# Patient Record
Sex: Male | Born: 1985 | Race: Black or African American | Hispanic: No | Marital: Single | Smoking: Never smoker
Health system: Southern US, Community
[De-identification: ages and names within clinical notes are randomized; demographics above are authoritative.]

## PROBLEM LIST (undated history)

## (undated) DIAGNOSIS — K859 Acute pancreatitis without necrosis or infection, unspecified: Secondary | ICD-10-CM

## (undated) DIAGNOSIS — T148XXA Other injury of unspecified body region, initial encounter: Secondary | ICD-10-CM

## (undated) HISTORY — PX: TONSILLECTOMY: SUR1361

## (undated) HISTORY — PX: APPENDECTOMY: SHX54

---
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
## 2017-11-10 ENCOUNTER — Emergency Department (HOSPITAL_COMMUNITY): Payer: 59

## 2017-11-10 ENCOUNTER — Ambulatory Visit (HOSPITAL_COMMUNITY)
Admission: EM | Admit: 2017-11-10 | Discharge: 2017-11-10 | Disposition: A | Discharge status: Home / Self Care | Payer: 59 | Attending: Family Medicine | Admitting: Family Medicine

## 2017-11-10 ENCOUNTER — Emergency Department (HOSPITAL_COMMUNITY)
Admission: EM | Admit: 2017-11-10 | Discharge: 2017-11-10 | Disposition: A | Discharge status: Home / Self Care | Payer: 59 | Attending: Emergency Medicine | Admitting: Emergency Medicine

## 2017-11-10 ENCOUNTER — Encounter (HOSPITAL_COMMUNITY): Payer: Self-pay

## 2017-11-10 ENCOUNTER — Other Ambulatory Visit: Payer: Self-pay

## 2017-11-10 DIAGNOSIS — R1012 Left upper quadrant pain: Secondary | ICD-10-CM | POA: Diagnosis present

## 2017-11-10 DIAGNOSIS — K859 Acute pancreatitis without necrosis or infection, unspecified: Secondary | ICD-10-CM | POA: Insufficient documentation

## 2017-11-10 DIAGNOSIS — R109 Unspecified abdominal pain: Secondary | ICD-10-CM | POA: Diagnosis not present

## 2017-11-10 LAB — COMPREHENSIVE METABOLIC PANEL
ALT: 36 U/L (ref 0–44)
AST: 42 U/L — AB (ref 15–41)
Albumin: 3.6 g/dL (ref 3.5–5.0)
Alkaline Phosphatase: 113 U/L (ref 38–126)
Anion gap: 14 (ref 5–15)
BUN: 6 mg/dL (ref 6–20)
CO2: 24 mmol/L (ref 22–32)
CREATININE: 1.09 mg/dL (ref 0.61–1.24)
Calcium: 9.8 mg/dL (ref 8.9–10.3)
Chloride: 98 mmol/L (ref 98–111)
GFR calc non Af Amer: >60 mL/min (ref >60)
Glucose, Bld: 113 mg/dL — ABNORMAL HIGH (ref 70–99)
POTASSIUM: 4.1 mmol/L (ref 3.5–5.1)
SODIUM: 136 mmol/L (ref 135–145)
Total Bilirubin: 1.9 mg/dL — ABNORMAL HIGH (ref 0.3–1.2)
Total Protein: 7.6 g/dL (ref 6.5–8.1)

## 2017-11-10 LAB — CBC
HEMATOCRIT: 48.4 % (ref 39.0–52.0)
Hemoglobin: 15.4 g/dL (ref 13.0–17.0)
MCH: 28.8 pg (ref 26.0–34.0)
MCHC: 31.8 g/dL (ref 30.0–36.0)
MCV: 90.5 fL (ref 78.0–100.0)
PLATELETS: 278 10*3/uL (ref 150–400)
RBC: 5.35 MIL/uL (ref 4.22–5.81)
RDW: 14.1 % (ref 11.5–15.5)
WBC: 11.4 10*3/uL — ABNORMAL HIGH (ref 4.0–10.5)

## 2017-11-10 LAB — URINALYSIS, ROUTINE W REFLEX MICROSCOPIC
BACTERIA UA: NONE SEEN
Bilirubin Urine: NEGATIVE
Glucose, UA: NEGATIVE mg/dL
Hgb urine dipstick: NEGATIVE
KETONES UR: NEGATIVE mg/dL
LEUKOCYTES UA: NEGATIVE
Nitrite: NEGATIVE
PROTEIN: 30 mg/dL — AB
Specific Gravity, Urine: 1.023 (ref 1.005–1.030)
pH: 5 (ref 5.0–8.0)

## 2017-11-10 LAB — LIPASE, BLOOD: Lipase: 124 U/L — ABNORMAL HIGH (ref 11–51)

## 2017-11-10 LAB — I-STAT TROPONIN, ED: TROPONIN I, POC: 0 ng/mL (ref 0.00–0.08)

## 2017-11-10 MED ORDER — HYDROCODONE-ACETAMINOPHEN 5-325 MG PO TABS: 2.0000 | ORAL_TABLET | ORAL | 0 refills | Status: DC | PRN

## 2017-11-10 MED ORDER — SODIUM CHLORIDE 0.9 % IV BOLUS
1000.0000 mL | Freq: Once | INTRAVENOUS | Status: AC
  Administered 2017-11-10: 1000 mL via INTRAVENOUS

## 2017-11-10 MED ORDER — IOPAMIDOL (ISOVUE-300) INJECTION 61%
INTRAVENOUS | Status: AC
  Filled 2017-11-10: qty 100

## 2017-11-10 MED ORDER — ONDANSETRON HCL 4 MG PO TABS: 4.0000 mg | ORAL_TABLET | Freq: Three times a day (TID) | ORAL | 0 refills | Status: DC | PRN

## 2017-11-10 MED ORDER — IOPAMIDOL (ISOVUE-300) INJECTION 61%
100.0000 mL | Freq: Once | INTRAVENOUS | Status: AC | PRN
  Administered 2017-11-10: 100 mL via INTRAVENOUS

## 2017-11-10 MED ORDER — MORPHINE SULFATE (PF) 4 MG/ML IV SOLN
4.0000 mg | Freq: Once | INTRAVENOUS | Status: AC
  Administered 2017-11-10: 4 mg via INTRAVENOUS
  Filled 2017-11-10: qty 1

## 2017-11-10 MED ORDER — ONDANSETRON HCL 4 MG/2ML IJ SOLN
4.0000 mg | Freq: Once | INTRAMUSCULAR | Status: AC
  Administered 2017-11-10: 4 mg via INTRAVENOUS
  Filled 2017-11-10: qty 2

## 2017-11-10 NOTE — ED Notes (Signed)
Patient transported to CT 

## 2017-11-10 NOTE — Discharge Instructions (Signed)
GO TO ER °

## 2017-11-10 NOTE — ED Triage Notes (Signed)
Patient complains of abdominal pain with radiation to back and constipation x 2 days, reports nausea with any intake. Appears uncomfortable on arrival. Constant pain x 2 days

## 2017-11-10 NOTE — ED Triage Notes (Signed)
Pt presents with abdominal pain mostly by sternum that radiates down to left upper quadrant and left side of back .

## 2017-11-10 NOTE — ED Notes (Signed)
Pt escorted to the ER.

## 2017-11-10 NOTE — ED Provider Notes (Signed)
[facility] EMERGENCY DEPARTMENT Provider Note   CSN: [id number] Arrival date & time: 11/10/17  4098     History   Chief Complaint No chief complaint on file.   HPI Henry Hendrix is a 32 y.o. male.  32 year old male presents with complaint of left upper abdominal pain.  Patient states that pain woke him from his sleep 2 nights ago in the epigastric area, described as a sore feeling, now located in the left upper quadrant area.  Patient reports 3 episodes of vomiting last night, feels nauseous if he eats or drinks anything.  Reports last bowel movement was 2 days ago, decrease in appetite, muscle ache in his back that is worse with movement, also chills/sweats, no fevers.  His abdominal surgeries include appendectomy. Denies chest pain, shortness of breath, changes in bladder habits. No other complaints or concerns.      History reviewed. No pertinent past medical history.  There are no active problems to display for this patient.   Past Surgical History:  Procedure Laterality Date  . APPENDECTOMY    . TONSILLECTOMY          Home Medications    Prior to Admission medications   Medication Sig Start Date End Date Taking? Authorizing Provider  HYDROcodone-acetaminophen (NORCO/VICODIN) 5-325 MG tablet Take 2 tablets by mouth every 4 (four) hours as needed. 11/10/17   Jeannie Fend, PA-C  ondansetron (ZOFRAN) 4 MG tablet Take 1 tablet (4 mg total) by mouth every 8 (eight) hours as needed for nausea or vomiting. 11/10/17   Jeannie Fend, PA-C    Family History No family history on file.  Social History Social History   Tobacco Use  . Smoking status: Never Smoker  . Smokeless tobacco: Never Used  Substance Use Topics  . Alcohol use: Yes  . Drug use: Not on file     Allergies   Patient has no known allergies.   Review of Systems Review of Systems  Constitutional: Positive for chills and diaphoresis. Negative for fever.  Respiratory: Negative  for shortness of breath.   Cardiovascular: Negative for chest pain.  Gastrointestinal: Positive for abdominal pain, constipation, nausea and vomiting. Negative for abdominal distention, blood in stool and diarrhea.  Genitourinary: Negative for difficulty urinating, dysuria, flank pain, frequency and urgency.  Musculoskeletal: Positive for back pain.  Skin: Negative for rash and wound.  Allergic/Immunologic: Negative for immunocompromised state.  Neurological: Negative for dizziness, weakness and headaches.  Hematological: Does not bruise/bleed easily.  Psychiatric/Behavioral: Negative for confusion.  All other systems reviewed and are negative.    Physical Exam Updated Vital Signs BP (!) 168/85 (BP Location: Right Arm)   Pulse 92   Temp 98.7 F (37.1 C) (Oral)   Resp 17   SpO2 99%   Physical Exam  Constitutional: He is oriented to person, place, and time. He appears well-developed and well-nourished. No distress.  HENT:  Head: Normocephalic and atraumatic.  Cardiovascular: Normal rate, regular rhythm, normal heart sounds and intact distal pulses.  No murmur heard. Pulmonary/Chest: Effort normal and breath sounds normal. No respiratory distress.  Abdominal: Soft. Bowel sounds are normal. He exhibits no distension. There is tenderness in the left upper quadrant. There is no guarding.  Musculoskeletal: He exhibits no tenderness.       Thoracic back: He exhibits no tenderness.       Lumbar back: He exhibits no tenderness.  Neurological: He is alert and oriented to person, place, and time. No sensory deficit.  Skin: Skin is warm and dry. He is not diaphoretic.  Psychiatric: He has a normal mood and affect. His behavior is normal.  Nursing note and vitals reviewed.    ED Treatments / Results  Labs (all labs ordered are listed, but only abnormal results are displayed) Labs Reviewed  LIPASE, BLOOD - Abnormal; Notable for the following components:      Result Value   Lipase 124  (*)    All other components within normal limits  COMPREHENSIVE METABOLIC PANEL - Abnormal; Notable for the following components:   Glucose, Bld 113 (*)    AST 42 (*)    Total Bilirubin 1.9 (*)    All other components within normal limits  CBC - Abnormal; Notable for the following components:   WBC 11.4 (*)    All other components within normal limits  URINALYSIS, ROUTINE W REFLEX MICROSCOPIC - Abnormal; Notable for the following components:   Color, Urine AMBER (*)    Protein, ur 30 (*)    All other components within normal limits  I-STAT TROPONIN, ED    EKG EKG Interpretation  Date/Time:  Friday November 10 2017 08:49:17 EDT Ventricular Rate:  97 PR Interval:  134 QRS Duration: 82 QT Interval:  354 QTC Calculation: 449 R Axis:   33 Text Interpretation:  Normal sinus rhythm Normal ECG No significant change was found Confirmed by Richardean Canal [phone]) on 11/10/2017 1:32:50 PM   Radiology Ct Abdomen Pelvis W Contrast  Result Date: 11/10/2017 CLINICAL DATA:  Epigastric and left upper quadrant abdominal pain. Elevated lipase. Nausea. EXAM: CT ABDOMEN AND PELVIS WITH CONTRAST TECHNIQUE: Multidetector CT imaging of the abdomen and pelvis was performed using the standard protocol following bolus administration of intravenous contrast. CONTRAST:  100 cc ISOVUE-300 IOPAMIDOL (ISOVUE-300) INJECTION 61% COMPARISON:  Radiographs dated 11/10/2017 FINDINGS: Lower chest: No significant abnormality. Hepatobiliary: There is hepatomegaly with hepatic steatosis. No focal liver lesions. Biliary tree is normal. Pancreas: There is soft tissue haziness around the pancreatic head. Body and tail of the pancreas are normal. No mass lesions. Spleen: Normal. Adrenals/Urinary Tract: Adrenal glands are unremarkable. Kidneys are normal, without renal calculi, focal lesion, or hydronephrosis. Bladder is unremarkable. Stomach/Bowel: Stomach is within normal limits. Appendix appears normal. No evidence of bowel wall  thickening, distention, or inflammatory changes. Vascular/Lymphatic: No significant vascular findings are present. There are a few scattered shotty periaortic lymph nodes. Reproductive: Prostate is unremarkable. Other: No abdominal wall hernia or abnormality. No abdominopelvic ascites. Musculoskeletal: No acute or significant osseous findings. IMPRESSION: 1. Findings consistent with acute pancreatitis involving the pancreatic head. No visible pancreatic necrosis or pseudocyst. 2. Hepatomegaly with diffuse hepatic steatosis. Electronically Signed   By: Francene Boyers M.D.   On: 11/10/2017 13:07   Dg Abd Acute W/chest  Result Date: 11/10/2017 CLINICAL DATA:  Abdominal pain EXAM: DG ABDOMEN ACUTE W/ 1V CHEST COMPARISON:  None. FINDINGS: There is no evidence of dilated bowel loops or free intraperitoneal air. No radiopaque calculi or other significant radiographic abnormality is seen. Heart size and mediastinal contours are within normal limits. Both lungs are clear. IMPRESSION: Negative abdominal radiographs.  No acute cardiopulmonary disease. Electronically Signed   By: Charlett Nose M.D.   On: 11/10/2017 10:10    Procedures Procedures (including critical care time)  Medications Ordered in ED Medications  iopamidol (ISOVUE-300) 61 % injection 100 mL (100 mLs Intravenous Contrast Given 11/10/17 1248)  sodium chloride 0.9 % bolus 1,000 mL (1,000 mLs Intravenous New Bag/Given 11/10/17 1404)  morphine  4 MG/ML injection 4 mg (4 mg Intravenous Given 11/10/17 1406)  ondansetron (ZOFRAN) injection 4 mg (4 mg Intravenous Given 11/10/17 1406)     Initial Impression / Assessment and Plan / ED Course  I have reviewed the triage vital signs and the nursing notes.  Pertinent labs & imaging results that were available during my care of the patient were reviewed by me and considered in my medical decision making (see chart for details).  Clinical Course as of Nov 11 1523  Fri Nov 10, 2017  1521 31yo male with  epigastric/LUQ abdominal pain with nausea and vomiting. No history of similar symptoms previously, previus appendectomy as a child. On exam, LUQ TTP, patient will appearing otherwise. CT with uncomplicated pancreatitis, elevated lipase at 124, elevated total bili at 1.9, normal CMP otherwise. WBC slightly elevated at 11.4, UA with protein. Discussed with Dr. Silverio LayYao, patient may be dc home if tolerating POs and pain controlled. Upon recheck, patient reports worsening pain. Patient was given morphine, zofran, fluids (did not need meds for pain initially). Recheck after meds, patient is feeling much her and is ready for discharge home.  Patient was given strict return to ER precautions, prescription for Norco, no recent prescriptions filled otherwise for controlled substances.  Also given prescription for Zofran.  Return to ER for fevers, worsening pain, uncontrolled vomiting or any other concerning symptoms.  Patient verbalizes understanding of discharge instructions and plan.   [LM]    Clinical Course User Index [LM] Jeannie FendMurphy, Lochlin Eppinger A, PA-C    Final Clinical Impressions(s) / ED Diagnoses   Final diagnoses:  Acute pancreatitis without infection or necrosis, unspecified pancreatitis type    ED Discharge Orders         Ordered    HYDROcodone-acetaminophen (NORCO/VICODIN) 5-325 MG tablet  Every 4 hours PRN     11/10/17 1519    ondansetron (ZOFRAN) 4 MG tablet  Every 8 hours PRN     11/10/17 1519           Jeannie FendMurphy, Deserai Cansler A, PA-C 11/10/17 1525    Charlynne PanderYao, David Hsienta, MD 11/12/17 2122

## 2017-11-10 NOTE — Discharge Instructions (Addendum)
Liquid diet, progress as tolerated. Norco as needed as prescribed for pain. Zofran as needed as prescribed for nausea/vomiting. Follow up with primary care provider, referral given if needed. Return to ER for worsening or concerning symptoms especially fever, worsening pain, uncontrolled vomiting.

## 2017-11-10 NOTE — ED Provider Notes (Signed)
[facility]    CSN: [id number] Arrival date & time: 11/10/17  0800     History   Chief Complaint Chief Complaint  Patient presents with  . Abdominal Pain    mostly by sternum but radiates down left side    HPI Henry Hendrix is a 32 y.o. male.   HPI  Patient is here for abdominal pain.  Is been present for several days.  He states that it is worse today than it was yesterday.  It is in the left upper and middle abdomen.  It is a crampy pain, worse in certain positions.  He has nausea and vomiting.  No diarrhea.  No fever chills.  He denies any new foods.  He denies excessive alcohol use.  He has not been around anybody who is sick.  History reviewed. No pertinent past medical history.  There are no active problems to display for this patient.   Past Surgical History:  Procedure Laterality Date  . APPENDECTOMY    . TONSILLECTOMY         Home Medications    Prior to Admission medications   Medication Sig Start Date End Date Taking? Authorizing Provider  HYDROcodone-acetaminophen (NORCO/VICODIN) 5-325 MG tablet Take 2 tablets by mouth every 4 (four) hours as needed. 11/10/17   Jeannie Fend, PA-C  ondansetron (ZOFRAN) 4 MG tablet Take 1 tablet (4 mg total) by mouth every 8 (eight) hours as needed for nausea or vomiting. 11/10/17   Jeannie Fend, PA-C    Family History History reviewed. No pertinent family history.  Social History Social History   Tobacco Use  . Smoking status: Never Smoker  . Smokeless tobacco: Never Used  Substance Use Topics  . Alcohol use: Yes  . Drug use: Not on file     Allergies   Patient has no known allergies.   Review of Systems Review of Systems  Constitutional: Negative for chills and fever.  HENT: Negative for ear pain and sore throat.   Eyes: Negative for pain and visual disturbance.  Respiratory: Negative for cough and shortness of breath.   Cardiovascular: Negative for chest pain and palpitations.    Gastrointestinal: Positive for abdominal pain, nausea and vomiting. Negative for abdominal distention and diarrhea.  Genitourinary: Negative for dysuria and hematuria.  Musculoskeletal: Negative for arthralgias and back pain.  Skin: Negative for color change and rash.  Neurological: Negative for seizures and syncope.  All other systems reviewed and are negative.    Physical Exam Triage Vital Signs ED Triage Vitals  Enc Vitals Group     BP 11/10/17 0822 (!) 147/99     Pulse Rate 11/10/17 0822 93     Resp 11/10/17 0822 20     Temp 11/10/17 0822 98.3 F (36.8 C)     Temp Source 11/10/17 0822 Oral     SpO2 11/10/17 0822 98 %     Weight --      Height --      Head Circumference --      Peak Flow --      Pain Score 11/10/17 0820 7     Pain Loc --      Pain Edu? --      Excl. in GC? --    No data found.  Updated Vital Signs BP (!) 147/99 (BP Location: Left Arm)   Pulse 93   Temp 98.3 F (36.8 C) (Oral)   Resp 20   SpO2 98%   Visual Acuity Right  Eye Distance:   Left Eye Distance:   Bilateral Distance:    Right Eye Near:   Left Eye Near:    Bilateral Near:     Physical Exam  Constitutional: He appears well-developed and well-nourished. No distress.  Patient is obviously uncomfortable.  He is standing beside the exam table bent forward at the waist leaning his forehead on the table.  Clutches abdomen.  HENT:  Head: Normocephalic and atraumatic.  Mouth/Throat: Oropharynx is clear and moist.  Eyes: Pupils are equal, round, and reactive to light. Conjunctivae are normal.  Neck: Normal range of motion.  Cardiovascular: Normal rate.  Pulmonary/Chest: Effort normal. No respiratory distress.  Abdominal: Soft. He exhibits no distension. Bowel sounds are decreased. There is no hepatosplenomegaly. There is tenderness in the epigastric area and left upper quadrant. There is rebound and guarding. There is no rigidity. No hernia.  Musculoskeletal: Normal range of motion. He  exhibits no edema.  Neurological: He is alert.  Skin: Skin is warm and dry.  Psychiatric: He has a normal mood and affect. His behavior is normal.     UC Treatments / Results  Labs (all labs ordered are listed, but only abnormal results are displayed) Labs Reviewed - No data to display  EKG None  Radiology Ct Abdomen Pelvis W Contrast  Result Date: 11/10/2017 CLINICAL DATA:  Epigastric and left upper quadrant abdominal pain. Elevated lipase. Nausea. EXAM: CT ABDOMEN AND PELVIS WITH CONTRAST TECHNIQUE: Multidetector CT imaging of the abdomen and pelvis was performed using the standard protocol following bolus administration of intravenous contrast. CONTRAST:  100 cc ISOVUE-300 IOPAMIDOL (ISOVUE-300) INJECTION 61% COMPARISON:  Radiographs dated 11/10/2017 FINDINGS: Lower chest: No significant abnormality. Hepatobiliary: There is hepatomegaly with hepatic steatosis. No focal liver lesions. Biliary tree is normal. Pancreas: There is soft tissue haziness around the pancreatic head. Body and tail of the pancreas are normal. No mass lesions. Spleen: Normal. Adrenals/Urinary Tract: Adrenal glands are unremarkable. Kidneys are normal, without renal calculi, focal lesion, or hydronephrosis. Bladder is unremarkable. Stomach/Bowel: Stomach is within normal limits. Appendix appears normal. No evidence of bowel wall thickening, distention, or inflammatory changes. Vascular/Lymphatic: No significant vascular findings are present. There are a few scattered shotty periaortic lymph nodes. Reproductive: Prostate is unremarkable. Other: No abdominal wall hernia or abnormality. No abdominopelvic ascites. Musculoskeletal: No acute or significant osseous findings. IMPRESSION: 1. Findings consistent with acute pancreatitis involving the pancreatic head. No visible pancreatic necrosis or pseudocyst. 2. Hepatomegaly with diffuse hepatic steatosis. Electronically Signed   By: Francene BoyersJames  Maxwell M.D.   On: 11/10/2017 13:07   Dg  Abd Acute W/chest  Result Date: 11/10/2017 CLINICAL DATA:  Abdominal pain EXAM: DG ABDOMEN ACUTE W/ 1V CHEST COMPARISON:  None. FINDINGS: There is no evidence of dilated bowel loops or free intraperitoneal air. No radiopaque calculi or other significant radiographic abnormality is seen. Heart size and mediastinal contours are within normal limits. Both lungs are clear. IMPRESSION: Negative abdominal radiographs.  No acute cardiopulmonary disease. Electronically Signed   By: Charlett NoseKevin  Dover M.D.   On: 11/10/2017 10:10    Procedures Procedures (including critical care time)  Medications Ordered in UC Medications - No data to display  Initial Impression / Assessment and Plan / UC Course  I have reviewed the triage vital signs and the nursing notes.  Pertinent labs & imaging results that were available during my care of the patient were reviewed by me and considered in my medical decision making (see chart for details).  I explained to the patient that there is limitation in the diagnostic tests that can be done at the urgent care center, and that in my opinion he belonged in the emergency room where he could have additional testing and scans. Final Clinical Impressions(s) / UC Diagnoses   Final diagnoses:  Acute abdominal pain     Discharge Instructions     GO TO ER   ED Prescriptions    None     Controlled Substance Prescriptions  Controlled Substance Registry consulted? Not Applicable   Eustace Moore, MD 11/10/17 [phone]

## 2017-11-10 NOTE — ED Notes (Signed)
Pt continues to have pain.  Medicated and repositioned for comfort

## 2017-11-10 NOTE — ED Notes (Signed)
Patient transported to X-ray 

## 2018-04-30 DIAGNOSIS — Z8719 Personal history of other diseases of the digestive system: Secondary | ICD-10-CM | POA: Insufficient documentation

## 2018-11-07 ENCOUNTER — Other Ambulatory Visit: Payer: Self-pay

## 2018-11-07 DIAGNOSIS — Z20822 Contact with and (suspected) exposure to covid-19: Secondary | ICD-10-CM

## 2018-11-08 LAB — NOVEL CORONAVIRUS, NAA: SARS-CoV-2, NAA: NOT DETECTED

## 2019-01-24 ENCOUNTER — Other Ambulatory Visit: Payer: Self-pay

## 2019-01-24 DIAGNOSIS — Z20822 Contact with and (suspected) exposure to covid-19: Secondary | ICD-10-CM

## 2019-01-25 LAB — NOVEL CORONAVIRUS, NAA: SARS-CoV-2, NAA: NOT DETECTED

## 2019-06-13 ENCOUNTER — Ambulatory Visit: Payer: Self-pay | Attending: Internal Medicine

## 2019-07-14 ENCOUNTER — Encounter (HOSPITAL_COMMUNITY): Payer: Self-pay | Admitting: Emergency Medicine

## 2019-07-14 ENCOUNTER — Other Ambulatory Visit: Payer: Self-pay

## 2019-07-14 ENCOUNTER — Emergency Department (HOSPITAL_COMMUNITY): Payer: Self-pay

## 2019-07-14 ENCOUNTER — Emergency Department (HOSPITAL_COMMUNITY)
Admission: EM | Admit: 2019-07-14 | Discharge: 2019-07-14 | Disposition: A | Discharge status: Home / Self Care | Payer: Self-pay | Attending: Emergency Medicine | Admitting: Emergency Medicine

## 2019-07-14 DIAGNOSIS — T1490XA Injury, unspecified, initial encounter: Secondary | ICD-10-CM

## 2019-07-14 DIAGNOSIS — Z20822 Contact with and (suspected) exposure to covid-19: Secondary | ICD-10-CM | POA: Insufficient documentation

## 2019-07-14 DIAGNOSIS — Z23 Encounter for immunization: Secondary | ICD-10-CM | POA: Insufficient documentation

## 2019-07-14 DIAGNOSIS — K869 Disease of pancreas, unspecified: Secondary | ICD-10-CM | POA: Insufficient documentation

## 2019-07-14 DIAGNOSIS — F10920 Alcohol use, unspecified with intoxication, uncomplicated: Secondary | ICD-10-CM | POA: Insufficient documentation

## 2019-07-14 DIAGNOSIS — Y906 Blood alcohol level of 120-199 mg/100 ml: Secondary | ICD-10-CM | POA: Insufficient documentation

## 2019-07-14 DIAGNOSIS — S21212A Laceration without foreign body of left back wall of thorax without penetration into thoracic cavity, initial encounter: Secondary | ICD-10-CM | POA: Insufficient documentation

## 2019-07-14 DIAGNOSIS — Y929 Unspecified place or not applicable: Secondary | ICD-10-CM | POA: Insufficient documentation

## 2019-07-14 DIAGNOSIS — R590 Localized enlarged lymph nodes: Secondary | ICD-10-CM | POA: Insufficient documentation

## 2019-07-14 DIAGNOSIS — T148XXA Other injury of unspecified body region, initial encounter: Secondary | ICD-10-CM

## 2019-07-14 DIAGNOSIS — Y939 Activity, unspecified: Secondary | ICD-10-CM | POA: Insufficient documentation

## 2019-07-14 DIAGNOSIS — Y999 Unspecified external cause status: Secondary | ICD-10-CM | POA: Insufficient documentation

## 2019-07-14 LAB — URINALYSIS, ROUTINE W REFLEX MICROSCOPIC
Bilirubin Urine: NEGATIVE
Glucose, UA: NEGATIVE mg/dL
Hgb urine dipstick: NEGATIVE
Ketones, ur: NEGATIVE mg/dL
Leukocytes,Ua: NEGATIVE
Nitrite: NEGATIVE
Protein, ur: NEGATIVE mg/dL
Specific Gravity, Urine: 1.032 — ABNORMAL HIGH (ref 1.005–1.030)
pH: 6 (ref 5.0–8.0)

## 2019-07-14 LAB — RESPIRATORY PANEL BY RT PCR (FLU A&B, COVID)
Influenza A by PCR: NEGATIVE
Influenza B by PCR: NEGATIVE
SARS Coronavirus 2 by RT PCR: NEGATIVE

## 2019-07-14 LAB — CBC
HCT: 43.4 % (ref 39.0–52.0)
Hemoglobin: 13.6 g/dL (ref 13.0–17.0)
MCH: 28.5 pg (ref 26.0–34.0)
MCHC: 31.3 g/dL (ref 30.0–36.0)
MCV: 91 fL (ref 80.0–100.0)
Platelets: 248 10*3/uL (ref 150–400)
RBC: 4.77 MIL/uL (ref 4.22–5.81)
RDW: 15.5 % (ref 11.5–15.5)
WBC: 8.6 10*3/uL (ref 4.0–10.5)
nRBC: 0 % (ref 0.0–0.2)

## 2019-07-14 LAB — COMPREHENSIVE METABOLIC PANEL
ALT: 37 U/L (ref 0–44)
AST: 74 U/L — ABNORMAL HIGH (ref 15–41)
Albumin: 2.9 g/dL — ABNORMAL LOW (ref 3.5–5.0)
Alkaline Phosphatase: 173 U/L — ABNORMAL HIGH (ref 38–126)
Anion gap: 18 — ABNORMAL HIGH (ref 5–15)
BUN: 8 mg/dL (ref 6–20)
CO2: 16 mmol/L — ABNORMAL LOW (ref 22–32)
Calcium: 8.4 mg/dL — ABNORMAL LOW (ref 8.9–10.3)
Chloride: 109 mmol/L (ref 98–111)
Creatinine, Ser: 1.24 mg/dL (ref 0.61–1.24)
GFR calc Af Amer: >60 mL/min (ref >60)
GFR calc non Af Amer: >60 mL/min (ref >60)
Glucose, Bld: 125 mg/dL — ABNORMAL HIGH (ref 70–99)
Potassium: 3.8 mmol/L (ref 3.5–5.1)
Sodium: 143 mmol/L (ref 135–145)
Total Bilirubin: 0.5 mg/dL (ref 0.3–1.2)
Total Protein: 5.9 g/dL — ABNORMAL LOW (ref 6.5–8.1)

## 2019-07-14 LAB — I-STAT CHEM 8, ED
BUN: 9 mg/dL (ref 6–20)
Calcium, Ion: 1.01 mmol/L — ABNORMAL LOW (ref 1.15–1.40)
Chloride: 108 mmol/L (ref 98–111)
Creatinine, Ser: 1.3 mg/dL — ABNORMAL HIGH (ref 0.61–1.24)
Glucose, Bld: 120 mg/dL — ABNORMAL HIGH (ref 70–99)
HCT: 43 % (ref 39.0–52.0)
Hemoglobin: 14.6 g/dL (ref 13.0–17.0)
Potassium: 4.4 mmol/L (ref 3.5–5.1)
Sodium: 143 mmol/L (ref 135–145)
TCO2: 19 mmol/L — ABNORMAL LOW (ref 22–32)

## 2019-07-14 LAB — PROTIME-INR
INR: 1 (ref 0.8–1.2)
Prothrombin Time: 13.1 seconds (ref 11.4–15.2)

## 2019-07-14 LAB — SAMPLE TO BLOOD BANK

## 2019-07-14 LAB — LACTIC ACID, PLASMA
Lactic Acid, Venous: 8.2 mmol/L (ref 0.5–1.9)
Lactic Acid, Venous: 6 mmol/L (ref 0.5–1.9)

## 2019-07-14 LAB — ETHANOL: Alcohol, Ethyl (B): 163 mg/dL — ABNORMAL HIGH (ref <10)

## 2019-07-14 MED ORDER — KETOROLAC TROMETHAMINE 30 MG/ML IJ SOLN
30.0000 mg | Freq: Once | INTRAMUSCULAR | Status: AC
  Administered 2019-07-14: 30 mg via INTRAVENOUS
  Filled 2019-07-14: qty 1

## 2019-07-14 MED ORDER — CEPHALEXIN 500 MG PO CAPS
500.0000 mg | ORAL_CAPSULE | Freq: Four times a day (QID) | ORAL | 0 refills | Status: DC
Start: 2019-07-14 — End: 2019-08-02

## 2019-07-14 MED ORDER — CEFAZOLIN SODIUM-DEXTROSE 1-4 GM/50ML-% IV SOLN
1.0000 g | Freq: Once | INTRAVENOUS | Status: AC
  Administered 2019-07-14: 1 g via INTRAVENOUS

## 2019-07-14 MED ORDER — IOHEXOL 300 MG/ML  SOLN
100.0000 mL | Freq: Once | INTRAMUSCULAR | Status: AC | PRN
  Administered 2019-07-14: 100 mL via INTRAVENOUS

## 2019-07-14 MED ORDER — TETANUS-DIPHTH-ACELL PERTUSSIS 5-2.5-18.5 LF-MCG/0.5 IM SUSP
0.5000 mL | Freq: Once | INTRAMUSCULAR | Status: AC
  Administered 2019-07-14: 0.5 mL via INTRAMUSCULAR

## 2019-07-14 MED ORDER — LIDOCAINE-EPINEPHRINE (PF) 2 %-1:200000 IJ SOLN
INTRAMUSCULAR | Status: AC
  Administered 2019-07-14: 20 mL
  Filled 2019-07-14: qty 20

## 2019-07-14 MED ORDER — NAPROXEN 375 MG PO TABS
375.0000 mg | ORAL_TABLET | Freq: Two times a day (BID) | ORAL | 0 refills | Status: DC
Start: 2019-07-14 — End: 2019-08-02

## 2019-07-14 NOTE — ED Notes (Signed)
Date and time results received: 07/14/19  (use smartphrase ".now" to insert current time)  Test: lactic Critical Value: 8.2  Name of Provider Notified: Marya Fossa
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
## 2019-07-14 NOTE — Discharge Instructions (Addendum)
Follow up with GI and community wellness for pancreatic lesion seen on CT scan you will need an outpatient MRI of this area.
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
## 2019-07-14 NOTE — ED Notes (Signed)
Phone provided to the pt to talk and update mom over the phone. Awaiting for more detail for POC.

## 2019-07-15 ENCOUNTER — Encounter (HOSPITAL_COMMUNITY): Payer: Self-pay

## 2019-07-17 ENCOUNTER — Encounter: Payer: Self-pay | Admitting: Nurse Practitioner

## 2019-07-25 ENCOUNTER — Ambulatory Visit (HOSPITAL_COMMUNITY)
Admission: EM | Admit: 2019-07-25 | Discharge: 2019-07-25 | Disposition: A | Discharge status: Home / Self Care | Payer: BC Managed Care – PPO

## 2019-07-25 ENCOUNTER — Other Ambulatory Visit: Payer: Self-pay
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
## 2019-08-01 ENCOUNTER — Ambulatory Visit: Payer: Self-pay | Admitting: Nurse Practitioner

## 2019-08-02 ENCOUNTER — Ambulatory Visit (HOSPITAL_COMMUNITY)
Admission: EM | Admit: 2019-08-02 | Discharge: 2019-08-02 | Disposition: A | Discharge status: Home / Self Care | Payer: BC Managed Care – PPO | Attending: Family Medicine | Admitting: Family Medicine

## 2019-08-02 ENCOUNTER — Encounter (HOSPITAL_COMMUNITY): Payer: Self-pay

## 2019-08-02 ENCOUNTER — Telehealth: Payer: BC Managed Care – PPO

## 2019-08-02 ENCOUNTER — Other Ambulatory Visit: Payer: Self-pay

## 2019-08-02 DIAGNOSIS — R1084 Generalized abdominal pain: Secondary | ICD-10-CM

## 2019-08-02 DIAGNOSIS — K5900 Constipation, unspecified: Secondary | ICD-10-CM

## 2019-08-02 DIAGNOSIS — Z8719 Personal history of other diseases of the digestive system: Secondary | ICD-10-CM

## 2019-08-02 HISTORY — DX: Other injury of unspecified body region, initial encounter: T14.8XXA

## 2019-08-02 HISTORY — DX: Acute pancreatitis without necrosis or infection, unspecified: K85.90

## 2019-08-02 MED ORDER — ONDANSETRON HCL 4 MG PO TABS: 4.0000 mg | ORAL_TABLET | Freq: Three times a day (TID) | ORAL | 0 refills | Status: AC | PRN

## 2019-08-02 MED ORDER — HYDROCODONE-ACETAMINOPHEN 7.5-325 MG PO TABS: 1.0000 | ORAL_TABLET | Freq: Four times a day (QID) | ORAL | 0 refills | Status: AC | PRN

## 2019-08-02 NOTE — ED Triage Notes (Signed)
Pt presents with generalized abdominal pain, nausea and vomiting; pt states he believes he ate something that aggravated his acute pancreatitis.  Pt states he has a follow up scheduled with gastroenterology.

## 2019-08-02 NOTE — Discharge Instructions (Signed)
Drink plenty of water May advance to bland diet as tolerated Take ondansetron (Zofran) as needed for nausea and vomiting Take hydrocodone as needed for pain Do not drive on hydrocodone Take MiraLAX (OTC) daily to prevent constipation Follow-up with your primary care doctor/gastroenterology
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
## 2021-10-15 ENCOUNTER — Encounter (HOSPITAL_COMMUNITY): Payer: Self-pay | Admitting: Internal Medicine

## 2021-10-15 ENCOUNTER — Inpatient Hospital Stay (HOSPITAL_BASED_OUTPATIENT_CLINIC_OR_DEPARTMENT_OTHER)
Admission: EM | Admit: 2021-10-15 | Discharge: 2021-10-17 | DRG: 872 | Disposition: A | Discharge status: Home / Self Care | Payer: Self-pay | Attending: Internal Medicine | Admitting: Internal Medicine

## 2021-10-15 ENCOUNTER — Other Ambulatory Visit: Payer: Self-pay

## 2021-10-15 ENCOUNTER — Emergency Department (HOSPITAL_BASED_OUTPATIENT_CLINIC_OR_DEPARTMENT_OTHER): Payer: Self-pay

## 2021-10-15 DIAGNOSIS — L03211 Cellulitis of face: Secondary | ICD-10-CM

## 2021-10-15 DIAGNOSIS — Z9049 Acquired absence of other specified parts of digestive tract: Secondary | ICD-10-CM

## 2021-10-15 DIAGNOSIS — A419 Sepsis, unspecified organism: Principal | ICD-10-CM

## 2021-10-15 LAB — COMPREHENSIVE METABOLIC PANEL
ALT: 38 U/L (ref 0–44)
AST: 70 U/L — ABNORMAL HIGH (ref 15–41)
Albumin: 4.2 g/dL (ref 3.5–5.0)
Alkaline Phosphatase: 272 U/L — ABNORMAL HIGH (ref 38–126)
Anion gap: 13 (ref 5–15)
BUN: 10 mg/dL (ref 6–20)
CO2: 24 mmol/L (ref 22–32)
Calcium: 9.4 mg/dL (ref 8.9–10.3)
Chloride: 102 mmol/L (ref 98–111)
Creatinine, Ser: 0.71 mg/dL (ref 0.61–1.24)
GFR, Estimated: >60 mL/min (ref >60)
Glucose, Bld: 91 mg/dL (ref 70–99)
Potassium: 3.8 mmol/L (ref 3.5–5.1)
Sodium: 139 mmol/L (ref 135–145)
Total Bilirubin: 0.9 mg/dL (ref 0.3–1.2)
Total Protein: 7.4 g/dL (ref 6.5–8.1)

## 2021-10-15 LAB — CBC WITH DIFFERENTIAL/PLATELET
Abs Immature Granulocytes: 0.01 10*3/uL (ref 0.00–0.07)
Basophils Absolute: 0.1 10*3/uL (ref 0.0–0.1)
Basophils Relative: 1 %
Eosinophils Absolute: 0.1 10*3/uL (ref 0.0–0.5)
Eosinophils Relative: 1 %
HCT: 40.3 % (ref 39.0–52.0)
Hemoglobin: 13.5 g/dL (ref 13.0–17.0)
Immature Granulocytes: 0 %
Lymphocytes Relative: 30 %
Lymphs Abs: 1.9 10*3/uL (ref 0.7–4.0)
MCH: 30.8 pg (ref 26.0–34.0)
MCHC: 33.5 g/dL (ref 30.0–36.0)
MCV: 92 fL (ref 80.0–100.0)
Monocytes Absolute: 0.9 10*3/uL (ref 0.1–1.0)
Monocytes Relative: 14 %
Neutro Abs: 3.4 10*3/uL (ref 1.7–7.7)
Neutrophils Relative %: 54 %
Platelets: 215 10*3/uL (ref 150–400)
RBC: 4.38 MIL/uL (ref 4.22–5.81)
RDW: 12.8 % (ref 11.5–15.5)
WBC: 6.3 10*3/uL (ref 4.0–10.5)
nRBC: 0 % (ref 0.0–0.2)

## 2021-10-15 MED ORDER — CHLORHEXIDINE GLUCONATE CLOTH 2 % EX PADS
6.0000 | MEDICATED_PAD | Freq: Every day | CUTANEOUS | Status: DC
Start: 2021-10-16 — End: 2021-10-17

## 2021-10-15 MED ORDER — FENTANYL CITRATE PF 50 MCG/ML IJ SOSY: 50.0000 ug | PREFILLED_SYRINGE | INTRAMUSCULAR | Status: DC | PRN

## 2021-10-15 MED ORDER — IOHEXOL 300 MG/ML  SOLN
100.0000 mL | Freq: Once | INTRAMUSCULAR | Status: AC | PRN
  Administered 2021-10-15: 75 mL via INTRAVENOUS

## 2021-10-15 MED ORDER — ACETAMINOPHEN 650 MG RE SUPP: 650.0000 mg | Freq: Four times a day (QID) | RECTAL | Status: DC | PRN

## 2021-10-15 MED ORDER — ACETAMINOPHEN 325 MG PO TABS
650.0000 mg | ORAL_TABLET | Freq: Once | ORAL | Status: AC
  Administered 2021-10-15: 650 mg via ORAL
  Filled 2021-10-15: qty 2

## 2021-10-15 MED ORDER — SODIUM CHLORIDE 0.9 % IV SOLN
3.0000 g | Freq: Once | INTRAVENOUS | Status: AC
  Administered 2021-10-15: 3 g via INTRAVENOUS

## 2021-10-15 MED ORDER — ORAL CARE MOUTH RINSE: 15.0000 mL | OROMUCOSAL | Status: DC | PRN

## 2021-10-15 MED ORDER — ACETAMINOPHEN 325 MG PO TABS
650.0000 mg | ORAL_TABLET | Freq: Four times a day (QID) | ORAL | Status: DC | PRN
  Administered 2021-10-15 – 2021-10-17 (×5): 650 mg via ORAL
  Filled 2021-10-15 (×5): qty 2

## 2021-10-15 MED ORDER — NALOXONE HCL 0.4 MG/ML IJ SOLN: 0.4000 mg | INTRAMUSCULAR | Status: DC | PRN

## 2021-10-15 MED ORDER — ONDANSETRON HCL 4 MG/2ML IJ SOLN: 4.0000 mg | Freq: Four times a day (QID) | INTRAMUSCULAR | Status: DC | PRN

## 2021-10-15 MED ORDER — SODIUM CHLORIDE 0.9 % IV SOLN
3.0000 g | Freq: Four times a day (QID) | INTRAVENOUS | Status: DC
  Administered 2021-10-15 – 2021-10-17 (×7): 3 g via INTRAVENOUS
  Filled 2021-10-15 (×8): qty 8

## 2021-10-15 NOTE — ED Notes (Signed)
Patient transported to CT 

## 2021-10-15 NOTE — ED Provider Notes (Signed)
[facility] EMERGENCY DEPT Provider Note   CSN: [id number] Arrival date & time: 10/15/21  1044     History  Chief Complaint  Patient presents with   Facial Swelling    Henry Hendrix is a 36 y.o. male.  Patient here with left-sided facial swelling for the last for 5 days.  Denies any medical history.  No diabetes history.  He thought he had pinkeye and he tried to treat it with some over-the-counter medications within get better.  He has more significant swelling now over the left side of his face.  Denies any jaw pain, difficulty swallowing, tongue swelling, lip swelling, vision problems.  No headache.  Does not think he had a fever.  Nothing makes it worse or better.  The history is provided by the patient.       Home Medications Prior to Admission medications   Medication Sig Start Date End Date Taking? Authorizing Provider  HYDROcodone-acetaminophen (NORCO) 7.5-325 MG tablet Take 1 tablet by mouth every 6 (six) hours as needed for moderate pain. 08/02/19   Raylene Everts, MD  ondansetron (ZOFRAN) 4 MG tablet Take 1-2 tablets (4-8 mg total) by mouth every 8 (eight) hours as needed for nausea or vomiting. 08/02/19   Raylene Everts, MD      Allergies    Patient has no known allergies.    Review of Systems   Review of Systems  Physical Exam Updated Vital Signs  ED Triage Vitals  Enc Vitals Group     BP 10/15/21 1103 (!) 140/93     Pulse Rate 10/15/21 1103 93     Resp 10/15/21 1103 18     Temp 10/15/21 1103 (!) 101 F (38.3 C)     Temp Source 10/15/21 1103 Oral     SpO2 10/15/21 1103 99 %     Weight 10/15/21 1105 250 lb (113.4 kg)     Height 10/15/21 1104 6' (1.829 m)     Head Circumference --      Peak Flow --      Pain Score --      Pain Loc --      Pain Edu? --      Excl. in [place]? --     Physical Exam Vitals and nursing note reviewed.  Constitutional:      General: He is not in acute distress.    Appearance: He is well-developed. He is  not ill-appearing.  HENT:     Head: Normocephalic and atraumatic.     Comments: There is swelling over the left side of the face over the maxillary sinus, upper mandible with some extension and swelling along the left-sided nasal fold, extraocular movements are intact with no significant swelling around the periorbital space    Nose: Nose normal.     Mouth/Throat:     Mouth: Mucous membranes are moist.  Eyes:     Extraocular Movements: Extraocular movements intact.     Conjunctiva/sclera: Conjunctivae normal.     Pupils: Pupils are equal, round, and reactive to light.  Cardiovascular:     Rate and Rhythm: Normal rate and regular rhythm.     Heart sounds: No murmur heard. Pulmonary:     Effort: Pulmonary effort is normal. No respiratory distress.     Breath sounds: Normal breath sounds.  Abdominal:     Palpations: Abdomen is soft.     Tenderness: There is no abdominal tenderness.  Musculoskeletal:        General: No swelling.  Cervical back: Normal range of motion and neck supple.  Skin:    General: Skin is warm and dry.     Capillary Refill: Capillary refill takes less than 2 seconds.  Neurological:     Mental Status: He is alert.  Psychiatric:        Mood and Affect: Mood normal.     ED Results / Procedures / Treatments   Labs (all labs ordered are listed, but only abnormal results are displayed) Labs Reviewed  COMPREHENSIVE METABOLIC PANEL - Abnormal; Notable for the following components:      Result Value   AST 70 (*)    Alkaline Phosphatase 272 (*)    All other components within normal limits  CBC WITH DIFFERENTIAL/PLATELET    EKG None  Radiology CT Maxillofacial W Contrast  Result Date: 10/15/2021 CLINICAL DATA:  Left-sided facial swelling EXAM: CT MAXILLOFACIAL WITH CONTRAST TECHNIQUE: Multidetector CT imaging of the maxillofacial structures was performed with intravenous contrast. Multiplanar CT image reconstructions were also generated. RADIATION DOSE  REDUCTION: This exam was performed according to the departmental dose-optimization program which includes automated exposure control, adjustment of the mA and/or kV according to patient size and/or use of iterative reconstruction technique. CONTRAST:  53mL OMNIPAQUE IOHEXOL 300 MG/ML  SOLN COMPARISON:  None Available. FINDINGS: Osseous: There is no acute osseous abnormality or suspicious osseous lesion. Orbits: The globes are intact. Extraocular muscles are normal. The orbital fat is preserved. There is no evidence of postseptal cellulitis on either side. Sinuses: There is mild mucosal thickening in the paranasal sinuses. Soft tissues: There is fairly extensive subcutaneous fat stranding throughout the left side of the face with asymmetric thickening of the platysma muscle. There is a 1.2 cm AP x 1.5 cm TV ill-defined peripherally enhancing hypodense collection in the left pre maxillary soft tissues. This is adjacent to a cortical defect along the lateral maxillary alveolar ridge with mild periapical lucency around the root of the associated left maxillary second premolar (2-40, 6-56). Limited intracranial: Are calcifications in the right basal ganglia. The imaged intracranial compartment is otherwise unremarkable. IMPRESSION: 1.5 cm phlegmon/early abscess in the left pre maxillary soft tissues likely odontogenic in nature given periapical lucency around the adjacent left maxillary second premolar with a cortical defect along the lateral maxillary alveolar ridge. Extensive swelling in the surrounding left facial soft tissues but no evidence of orbital cellulitis. Electronically Signed   By: Lesia Hausen M.D.   On: 10/15/2021 12:27    Procedures Procedures    Medications Ordered in ED Medications  acetaminophen (TYLENOL) tablet 650 mg (650 mg Oral Given 10/15/21 1148)  Ampicillin-Sulbactam (UNASYN) 3 g in sodium chloride 0.9 % 100 mL IVPB (0 g Intravenous Stopped 10/15/21 1243)  iohexol (OMNIPAQUE) 300 MG/ML  solution 100 mL (75 mLs Intravenous Contrast Given 10/15/21 1206)    ED Course/ Medical Decision Making/ A&P                           Medical Decision Making Amount and/or Complexity of Data Reviewed Labs: ordered. Radiology: ordered.  Risk OTC drugs. Prescription drug management. Decision regarding hospitalization.   Henry Hendrix is here with left-sided facial swelling.  Patient febrile but otherwise normal vitals.  Well-appearing.  Has swelling there for the last for 5 days.  Differential diagnosis is that this is likely dental abscess, may be a facial abscess.  I have much lesser concern for orbital/periorbital cellulitis.  We will get CBC, BMP, give  Tylenol, IV antibiotics and get a CT scan of the face for further evaluation.  Per my review and interpretation of labs is no significant anemia, electrolyte abnormality, kidney injury.  CT scan of the face showed 1.5 cm phlegmon may be early developing abscess in the left premaxillary soft tissues.  This is likely dental in nature given periapical lucency around the adjacent left maxillary second premolar.  There is extensive swelling in the surrounding left facial soft tissues but no evidence of orbital cellulitis.  Patient is ready feeling some improvement following IV antibiotic dose.  Overall we will admit for observation for IV antibiotics and suspect outpatient dental follow-up.  Patient to admitted to hospitalist service.  This chart was dictated using voice recognition software.  Despite best efforts to proofread,  errors can occur which can change the documentation meaning.         Final Clinical Impression(s) / ED Diagnoses Final diagnoses:  Facial cellulitis    Rx / DC Orders ED Discharge Orders     None         Virgina Norfolk, DO 10/15/21 1258
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
## 2021-10-15 NOTE — ED Triage Notes (Signed)
Patient arrives with complaints of worsening left-sided facial swelling. Patient states that he initially had pink eye over one week ago. Patient was sent here from urgent care to rule out orbital cellulitis

## 2021-10-15 NOTE — ED Notes (Signed)
[facility] at bedside at this time. Patients mother at bedside as well.

## 2021-10-15 NOTE — ED Notes (Signed)
Report Called to Autumn, Charity fundraiser at [employer]

## 2021-10-15 NOTE — ED Notes (Addendum)
Off the floor with [facility] at this time. Handoff given [facility] staff as well

## 2021-10-16 ENCOUNTER — Encounter (HOSPITAL_COMMUNITY): Payer: Self-pay | Admitting: Internal Medicine

## 2021-10-16 DIAGNOSIS — A419 Sepsis, unspecified organism: Secondary | ICD-10-CM | POA: Diagnosis present

## 2021-10-16 LAB — CBC WITH DIFFERENTIAL/PLATELET
Abs Immature Granulocytes: 0.02 10*3/uL (ref 0.00–0.07)
Basophils Absolute: 0 10*3/uL (ref 0.0–0.1)
Basophils Relative: 1 %
Eosinophils Absolute: 0.1 10*3/uL (ref 0.0–0.5)
Eosinophils Relative: 2 %
HCT: 43.4 % (ref 39.0–52.0)
Hemoglobin: 14.1 g/dL (ref 13.0–17.0)
Immature Granulocytes: 0 %
Lymphocytes Relative: 35 %
Lymphs Abs: 2.2 10*3/uL (ref 0.7–4.0)
MCH: 30.7 pg (ref 26.0–34.0)
MCHC: 32.5 g/dL (ref 30.0–36.0)
MCV: 94.3 fL (ref 80.0–100.0)
Monocytes Absolute: 1 10*3/uL (ref 0.1–1.0)
Monocytes Relative: 16 %
Neutro Abs: 2.8 10*3/uL (ref 1.7–7.7)
Neutrophils Relative %: 46 %
Platelets: 199 10*3/uL (ref 150–400)
RBC: 4.6 MIL/uL (ref 4.22–5.81)
RDW: 12.6 % (ref 11.5–15.5)
WBC: 6.2 10*3/uL (ref 4.0–10.5)
nRBC: 0 % (ref 0.0–0.2)

## 2021-10-16 LAB — COMPREHENSIVE METABOLIC PANEL
ALT: 46 U/L — ABNORMAL HIGH (ref 0–44)
AST: 86 U/L — ABNORMAL HIGH (ref 15–41)
Albumin: 3.5 g/dL (ref 3.5–5.0)
Alkaline Phosphatase: 289 U/L — ABNORMAL HIGH (ref 38–126)
Anion gap: 10 (ref 5–15)
BUN: 10 mg/dL (ref 6–20)
CO2: 24 mmol/L (ref 22–32)
Calcium: 9 mg/dL (ref 8.9–10.3)
Chloride: 104 mmol/L (ref 98–111)
Creatinine, Ser: 0.7 mg/dL (ref 0.61–1.24)
GFR, Estimated: >60 mL/min (ref >60)
Glucose, Bld: 100 mg/dL — ABNORMAL HIGH (ref 70–99)
Potassium: 3.4 mmol/L — ABNORMAL LOW (ref 3.5–5.1)
Sodium: 138 mmol/L (ref 135–145)
Total Bilirubin: 1.2 mg/dL (ref 0.3–1.2)
Total Protein: 7.7 g/dL (ref 6.5–8.1)

## 2021-10-16 LAB — MAGNESIUM: Magnesium: 1.8 mg/dL (ref 1.7–2.4)

## 2021-10-16 LAB — LACTIC ACID, PLASMA: Lactic Acid, Venous: 1.3 mmol/L (ref 0.5–1.9)

## 2021-10-16 MED ORDER — SENNOSIDES-DOCUSATE SODIUM 8.6-50 MG PO TABS: 1.0000 | ORAL_TABLET | Freq: Every evening | ORAL | Status: DC | PRN

## 2021-10-16 MED ORDER — OXYCODONE HCL 5 MG PO TABS: 5.0000 mg | ORAL_TABLET | ORAL | Status: DC | PRN

## 2021-10-16 MED ORDER — TRAZODONE HCL 50 MG PO TABS: 50.0000 mg | ORAL_TABLET | Freq: Every evening | ORAL | Status: DC | PRN

## 2021-10-16 MED ORDER — IPRATROPIUM-ALBUTEROL 0.5-2.5 (3) MG/3ML IN SOLN: 3.0000 mL | RESPIRATORY_TRACT | Status: DC | PRN

## 2021-10-16 MED ORDER — POTASSIUM CHLORIDE 10 MEQ/100ML IV SOLN
10.0000 meq | INTRAVENOUS | Status: AC
  Administered 2021-10-16 (×4): 10 meq via INTRAVENOUS
  Filled 2021-10-16 (×2): qty 100

## 2021-10-16 MED ORDER — HYDRALAZINE HCL 20 MG/ML IJ SOLN: 10.0000 mg | INTRAMUSCULAR | Status: DC | PRN

## 2021-10-16 MED ORDER — KETOROLAC TROMETHAMINE 15 MG/ML IJ SOLN
15.0000 mg | Freq: Once | INTRAMUSCULAR | Status: AC
  Administered 2021-10-16: 15 mg via INTRAVENOUS
  Filled 2021-10-16: qty 1

## 2021-10-16 MED ORDER — METOPROLOL TARTRATE 5 MG/5ML IV SOLN: 5.0000 mg | INTRAVENOUS | Status: DC | PRN

## 2021-10-16 NOTE — Progress Notes (Signed)
PROGRESS NOTE    Henry Hendrix  FKC:[id number] DOB: 1985-11-22 DOA: 10/15/2021 PCP: Patient, No Pcp Per   Brief Narrative:  36 year old with past medical history of pancreatitis comes [facility] the [facility] for concerns of left-sided facial cellulitis which started prior days prior to admission.  Patient states he had conjunctivitis about 2 weeks ago which initially self resolve but then it returned about a week ago which progressed to left-sided facial swelling.  Upon admission he was started on IV Unasyn.  CT of the maxillofacial area showed extensive soft tissue swelling without evidence of deep infection.   Assessment & Plan:  Principal Problem:   Facial cellulitis Active Problems:   Sepsis (HCC)    Sepsis secondary to left-sided facial cellulitis - Sepsis physiology has resolved.  Doing well currently.  Continue IV Unasyn, slowly improving.  Diet as tolerated.    DVT prophylaxis: SCDs Start: 10/15/21 1925 Code Status: Full code Family Communication:    Patient still has significant left-sided facial swelling and erythema.  Continue IV antibiotics for at least next 24 hours.  Hopefully discharge tomorrow     Subjective: Patient reports that his left-sided facial swelling is slightly improved compared to few days ago but still not back to normal.  Denies any respiratory compromise I looked at the picture that patient took at home on his phone and compared with today, overall there is about 50% improvement.   Examination:  General exam: Appears calm and comfortable  Respiratory system: Clear to auscultation. Respiratory effort normal. Cardiovascular system: S1 & S2 heard, RRR. No JVD, murmurs, rubs, gallops or clicks. No pedal edema. Gastrointestinal system: Abdomen is nondistended, soft and nontender. No organomegaly or masses felt. Normal bowel sounds heard. Central nervous system: Alert and oriented. No focal neurological  deficits. Extremities: Symmetric 5 x 5 power. Skin: Left-sided facial swelling and erythema, improved Psychiatry: Judgement and insight appear normal. Mood & affect appropriate.     Objective: Vitals:   10/15/21 2254 10/16/21 0219 10/16/21 0628 10/16/21 1145  BP: (!) 143/89 (!) 101/53 (!) 152/97 126/85  Pulse: 60 78 63 84  Resp: 16  17 20   Temp: 98.9 F (37.2 C) 98.6 F (37 C) 98.8 F (37.1 C) 98.2 F (36.8 C)  TempSrc: Oral Oral Oral Oral  SpO2: 98% 99% 100% 100%  Weight:      Height:        Intake/Output Summary (Last 24 hours) at 10/16/2021 1223 Last data filed at 10/15/2021 2200 Gross per 24 hour  Intake 200 ml  Output --  Net 200 ml   Filed Weights   10/15/21 1105  Weight: 113.4 kg     Data Reviewed:   CBC: Recent Labs  Lab 10/15/21 1158 10/16/21 0641  WBC 6.3 6.2  NEUTROABS 3.4 2.8  HGB 13.5 14.1  HCT 40.3 43.4  MCV 92.0 94.3  PLT 215 199   Basic Metabolic Panel: Recent Labs  Lab 10/15/21 1158 10/16/21 0641  NA 139 138  K 3.8 3.4*  CL 102 104  CO2 24 24  GLUCOSE 91 100*  BUN 10 10  CREATININE 0.71 0.70  CALCIUM 9.4 9.0  MG  --  1.8   GFR: Estimated Creatinine Clearance: 165.9 mL/min (by C-G formula based on SCr of 0.7 mg/dL). Liver Function Tests: Recent Labs  Lab 10/15/21 1158 10/16/21 0641  AST 70* 86*  ALT 38 46*  ALKPHOS 272* 289*  BILITOT 0.9 1.2  PROT 7.4 7.7  ALBUMIN  4.2 3.5   No results for input(s): "LIPASE", "AMYLASE" in the last 168 hours. No results for input(s): "AMMONIA" in the last 168 hours. Coagulation Profile: No results for input(s): "INR", "PROTIME" in the last 168 hours. Cardiac Enzymes: No results for input(s): "CKTOTAL", "CKMB", "CKMBINDEX", "TROPONINI" in the last 168 hours. BNP (last 3 results) No results for input(s): "PROBNP" in the last 8760 hours. HbA1C: No results for input(s): "HGBA1C" in the last 72 hours. CBG: No results for input(s): "GLUCAP" in the last 168 hours. Lipid Profile: No  results for input(s): "CHOL", "HDL", "LDLCALC", "TRIG", "CHOLHDL", "LDLDIRECT" in the last 72 hours. Thyroid Function Tests: No results for input(s): "TSH", "T4TOTAL", "FREET4", "T3FREE", "THYROIDAB" in the last 72 hours. Anemia Panel: No results for input(s): "VITAMINB12", "FOLATE", "FERRITIN", "TIBC", "IRON", "RETICCTPCT" in the last 72 hours. Sepsis Labs: Recent Labs  Lab 10/16/21 0641  LATICACIDVEN 1.3    No results found for this or any previous visit (from the past 240 hour(s)).       Radiology Studies: CT Maxillofacial W Contrast  Result Date: 10/15/2021 CLINICAL DATA:  Left-sided facial swelling EXAM: CT MAXILLOFACIAL WITH CONTRAST TECHNIQUE: Multidetector CT imaging of the maxillofacial structures was performed with intravenous contrast. Multiplanar CT image reconstructions were also generated. RADIATION DOSE REDUCTION: This exam was performed according to the departmental dose-optimization program which includes automated exposure control, adjustment of the mA and/or kV according to patient size and/or use of iterative reconstruction technique. CONTRAST:  51mL OMNIPAQUE IOHEXOL 300 MG/ML  SOLN COMPARISON:  None Available. FINDINGS: Osseous: There is no acute osseous abnormality or suspicious osseous lesion. Orbits: The globes are intact. Extraocular muscles are normal. The orbital fat is preserved. There is no evidence of postseptal cellulitis on either side. Sinuses: There is mild mucosal thickening in the paranasal sinuses. Soft tissues: There is fairly extensive subcutaneous fat stranding throughout the left side of the face with asymmetric thickening of the platysma muscle. There is a 1.2 cm AP x 1.5 cm TV ill-defined peripherally enhancing hypodense collection in the left pre maxillary soft tissues. This is adjacent to a cortical defect along the lateral maxillary alveolar ridge with mild periapical lucency around the root of the associated left maxillary second premolar (2-40,  6-56). Limited intracranial: Are calcifications in the right basal ganglia. The imaged intracranial compartment is otherwise unremarkable. IMPRESSION: 1.5 cm phlegmon/early abscess in the left pre maxillary soft tissues likely odontogenic in nature given periapical lucency around the adjacent left maxillary second premolar with a cortical defect along the lateral maxillary alveolar ridge. Extensive swelling in the surrounding left facial soft tissues but no evidence of orbital cellulitis. Electronically Signed   By: Lesia Hausen M.D.   On: 10/15/2021 12:27        Scheduled Meds:  Chlorhexidine Gluconate Cloth  6 each Topical Daily   Continuous Infusions:  ampicillin-sulbactam (UNASYN) IV 3 g (10/16/21 0828)   potassium chloride       LOS: 0 days   Time spent= 35 mins    Adryen Cookson Joline Maxcy, MD [facility]  If 7PM-7AM, please contact night-coverage  10/16/2021, 12:23 PM
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
## 2021-10-17 LAB — CBC
HCT: 42.4 % (ref 39.0–52.0)
Hemoglobin: 13.7 g/dL (ref 13.0–17.0)
MCH: 30.4 pg (ref 26.0–34.0)
MCHC: 32.3 g/dL (ref 30.0–36.0)
MCV: 94 fL (ref 80.0–100.0)
Platelets: 167 10*3/uL (ref 150–400)
RBC: 4.51 MIL/uL (ref 4.22–5.81)
RDW: 12.5 % (ref 11.5–15.5)
WBC: 5.8 10*3/uL (ref 4.0–10.5)
nRBC: 0 % (ref 0.0–0.2)

## 2021-10-17 LAB — BASIC METABOLIC PANEL
Anion gap: 10 (ref 5–15)
BUN: 10 mg/dL (ref 6–20)
CO2: 21 mmol/L — ABNORMAL LOW (ref 22–32)
Calcium: 8.6 mg/dL — ABNORMAL LOW (ref 8.9–10.3)
Chloride: 102 mmol/L (ref 98–111)
Creatinine, Ser: 0.74 mg/dL (ref 0.61–1.24)
GFR, Estimated: >60 mL/min (ref >60)
Glucose, Bld: 102 mg/dL — ABNORMAL HIGH (ref 70–99)
Potassium: 4.2 mmol/L (ref 3.5–5.1)
Sodium: 133 mmol/L — ABNORMAL LOW (ref 135–145)

## 2021-10-17 LAB — MAGNESIUM: Magnesium: 1.9 mg/dL (ref 1.7–2.4)

## 2021-10-17 MED ORDER — AMOXICILLIN-POT CLAVULANATE 875-125 MG PO TABS: 1.0000 | ORAL_TABLET | Freq: Two times a day (BID) | ORAL | 0 refills | Status: AC

## 2021-10-17 NOTE — Discharge Summary (Signed)
Physician Discharge Summary  Henry Hendrix YIR:[id number] DOB: May 25, 1985 DOA: 10/15/2021  PCP: Patient, No Pcp Per  Admit date: 10/15/2021 Discharge date: 10/17/2021  Admitted From: Home Disposition:  Home  Recommendations for Outpatient Follow-up:  Follow up with PCP in 1-2 weeks Please obtain BMP/CBC in one week your next doctors visit.  PO Augmentin BID x 7 days   Discharge Condition: Stable CODE STATUS: Full code Diet recommendation: Regular  Brief/Interim Summary: 36 year old with past medical history of pancreatitis comes [facility] for concerns of left-sided facial cellulitis which started prior days prior to admission.  Patient states he had conjunctivitis about 2 weeks ago which initially self resolve but then it returned about a week ago which progressed to left-sided facial swelling.  Upon admission he was started on IV Unasyn.  CT of the maxillofacial area showed extensive soft tissue swelling without evidence of deep infection.  Patient did well on IV Unasyn and his symptoms significantly improved therefore he was transitioned to oral Augmentin for 7 more days.  He required minimum pain medication therefore advised him to use home Tylenol or ibuprofen as necessary.     Assessment & Plan:  Principal Problem:   Facial cellulitis Active Problems:   Sepsis (HCC)     Sepsis secondary to left-sided facial cellulitis - Sepsis physiology is resolved, he has done very well with IV Unasyn therefore will transition to oral Augmentin for 7 more days.  Over-the-counter pain medications.     Assessment and Plan: No notes have been filed under this hospital service. Service: Hospitalist      Body mass index is 33.91 kg/m.       Discharge Diagnoses:  Principal Problem:   Facial cellulitis Active Problems:   Sepsis (HCC)      Consultations: None  Subjective: Feels much better no complaints.  Wishes to go  home.  Discharge Exam: Vitals:   10/17/21 0557 10/17/21 1212  BP: (!) 149/98 (!) 158/94  Pulse: 81 88  Resp: 16 18  Temp: 98.7 F (37.1 C) 98.2 F (36.8 C)  SpO2: 100% 100%   Vitals:   10/16/21 0628 10/16/21 1145 10/17/21 0557 10/17/21 1212  BP: (!) 152/97 126/85 (!) 149/98 (!) 158/94  Pulse: 63 84 81 88  Resp: 17 20 16 18   Temp: 98.8 F (37.1 C) 98.2 F (36.8 C) 98.7 F (37.1 C) 98.2 F (36.8 C)  TempSrc: Oral Oral Oral Oral  SpO2: 100% 100% 100% 100%  Weight:      Height:        General: Pt is alert, awake, not in acute distress Cardiovascular: RRR, S1/S2 +, no rubs, no gallops Respiratory: CTA bilaterally, no wheezing, no rhonchi Abdominal: Soft, NT, ND, bowel sounds + Extremities: no edema, no cyanosis Mild left-sided facial swelling significantly improved  Discharge Instructions   Allergies as of 10/17/2021   No Known Allergies      Medication List     TAKE these medications    amoxicillin-clavulanate 875-125 MG tablet Commonly known as: AUGMENTIN Take 1 tablet by mouth 2 (two) times daily for 7 days.   HYDROcodone-acetaminophen 7.5-325 MG tablet Commonly known as: Norco Take 1 tablet by mouth every 6 (six) hours as needed for moderate pain.   ondansetron 4 MG tablet Commonly known as: ZOFRAN Take 1-2 tablets (4-8 mg total) by mouth every 8 (eight) hours as needed for nausea or vomiting.        No Known Allergies  You were cared for by a hospitalist during your hospital stay. If you have any questions about your discharge medications or the care you received while you were in the hospital after you are discharged, you can call the unit and asked to speak with the hospitalist on call if the hospitalist that took care of you is not available. Once you are discharged, your primary care physician will handle any further medical issues. Please note that no refills for any discharge medications will be authorized once you are discharged, as it is  imperative that you return to your primary care physician (or establish a relationship with a primary care physician if you do not have one) for your aftercare needs so that they can reassess your need for medications and monitor your lab values.   Procedures/Studies: CT Maxillofacial W Contrast  Result Date: 10/15/2021 CLINICAL DATA:  Left-sided facial swelling EXAM: CT MAXILLOFACIAL WITH CONTRAST TECHNIQUE: Multidetector CT imaging of the maxillofacial structures was performed with intravenous contrast. Multiplanar CT image reconstructions were also generated. RADIATION DOSE REDUCTION: This exam was performed according to the departmental dose-optimization program which includes automated exposure control, adjustment of the mA and/or kV according to patient size and/or use of iterative reconstruction technique. CONTRAST:  39mL OMNIPAQUE IOHEXOL 300 MG/ML  SOLN COMPARISON:  None Available. FINDINGS: Osseous: There is no acute osseous abnormality or suspicious osseous lesion. Orbits: The globes are intact. Extraocular muscles are normal. The orbital fat is preserved. There is no evidence of postseptal cellulitis on either side. Sinuses: There is mild mucosal thickening in the paranasal sinuses. Soft tissues: There is fairly extensive subcutaneous fat stranding throughout the left side of the face with asymmetric thickening of the platysma muscle. There is a 1.2 cm AP x 1.5 cm TV ill-defined peripherally enhancing hypodense collection in the left pre maxillary soft tissues. This is adjacent to a cortical defect along the lateral maxillary alveolar ridge with mild periapical lucency around the root of the associated left maxillary second premolar (2-40, 6-56). Limited intracranial: Are calcifications in the right basal ganglia. The imaged intracranial compartment is otherwise unremarkable. IMPRESSION: 1.5 cm phlegmon/early abscess in the left pre maxillary soft tissues likely odontogenic in nature given  periapical lucency around the adjacent left maxillary second premolar with a cortical defect along the lateral maxillary alveolar ridge. Extensive swelling in the surrounding left facial soft tissues but no evidence of orbital cellulitis. Electronically Signed   By: Lesia Hausen M.D.   On: 10/15/2021 12:27     The results of significant diagnostics from this hospitalization (including imaging, microbiology, ancillary and laboratory) are listed below for reference.     Microbiology: No results found for this or any previous visit (from the past 240 hour(s)).   Labs: BNP (last 3 results) No results for input(s): "BNP" in the last 8760 hours. Basic Metabolic Panel: Recent Labs  Lab 10/15/21 1158 10/16/21 0641 10/17/21 0549  NA 139 138 133*  K 3.8 3.4* 4.2  CL 102 104 102  CO2 24 24 21*  GLUCOSE 91 100* 102*  BUN 10 10 10   CREATININE 0.71 0.70 0.74  CALCIUM 9.4 9.0 8.6*  MG  --  1.8 1.9   Liver Function Tests: Recent Labs  Lab 10/15/21 1158 10/16/21 0641  AST 70* 86*  ALT 38 46*  ALKPHOS 272* 289*  BILITOT 0.9 1.2  PROT 7.4 7.7  ALBUMIN 4.2 3.5   No results for input(s): "LIPASE", "AMYLASE" in the last 168 hours. No results for input(s): "  AMMONIA" in the last 168 hours. CBC: Recent Labs  Lab 10/15/21 1158 10/16/21 0641 10/17/21 0549  WBC 6.3 6.2 5.8  NEUTROABS 3.4 2.8  --   HGB 13.5 14.1 13.7  HCT 40.3 43.4 42.4  MCV 92.0 94.3 94.0  PLT 215 199 167   Cardiac Enzymes: No results for input(s): "CKTOTAL", "CKMB", "CKMBINDEX", "TROPONINI" in the last 168 hours. BNP: Invalid input(s): "POCBNP" CBG: No results for input(s): "GLUCAP" in the last 168 hours. D-Dimer No results for input(s): "DDIMER" in the last 72 hours. Hgb A1c No results for input(s): "HGBA1C" in the last 72 hours. Lipid Profile No results for input(s): "CHOL", "HDL", "LDLCALC", "TRIG", "CHOLHDL", "LDLDIRECT" in the last 72 hours. Thyroid function studies No results for input(s): "TSH",  "T4TOTAL", "T3FREE", "THYROIDAB" in the last 72 hours.  Invalid input(s): "FREET3" Anemia work up No results for input(s): "VITAMINB12", "FOLATE", "FERRITIN", "TIBC", "IRON", "RETICCTPCT" in the last 72 hours. Urinalysis    Component Value Date/Time   COLORURINE YELLOW 07/14/2019 0626   APPEARANCEUR CLEAR 07/14/2019 0626   LABSPEC 1.032 (H) 07/14/2019 0626   PHURINE 6.0 07/14/2019 0626   GLUCOSEU NEGATIVE 07/14/2019 0626   HGBUR NEGATIVE 07/14/2019 0626   BILIRUBINUR NEGATIVE 07/14/2019 0626   KETONESUR NEGATIVE 07/14/2019 0626   PROTEINUR NEGATIVE 07/14/2019 0626   NITRITE NEGATIVE 07/14/2019 0626   LEUKOCYTESUR NEGATIVE 07/14/2019 0626   Sepsis Labs Recent Labs  Lab 10/15/21 1158 10/16/21 0641 10/17/21 0549  WBC 6.3 6.2 5.8   Microbiology No results found for this or any previous visit (from the past 240 hour(s)).   Time coordinating discharge:  I have spent 35 minutes face to face with the patient and on the ward discussing the patients care, assessment, plan and disposition with other care givers. >50% of the time was devoted counseling the patient about the risks and benefits of treatment/Discharge disposition and coordinating care.   SIGNED:   Dimple Nanas, MD  [facility] 10/17/2021, 12:20 PM   If 7PM-7AM, please contact night-coverage

## 2021-10-17 NOTE — Progress Notes (Signed)
Patient discharged to home with family, discharge instructions reviewed with patient who verbalized understanding. 

## 2022-03-13 ENCOUNTER — Emergency Department (HOSPITAL_BASED_OUTPATIENT_CLINIC_OR_DEPARTMENT_OTHER)
Admission: EM | Admit: 2022-03-13 | Discharge: 2022-03-13 | Disposition: A | Discharge status: Home / Self Care | Payer: Commercial Managed Care - HMO | Attending: Emergency Medicine | Admitting: Emergency Medicine

## 2022-03-13 ENCOUNTER — Encounter (HOSPITAL_BASED_OUTPATIENT_CLINIC_OR_DEPARTMENT_OTHER): Payer: Self-pay | Admitting: Emergency Medicine

## 2022-03-13 ENCOUNTER — Other Ambulatory Visit: Payer: Self-pay

## 2022-03-13 DIAGNOSIS — K047 Periapical abscess without sinus: Secondary | ICD-10-CM | POA: Diagnosis not present

## 2022-03-13 DIAGNOSIS — L089 Local infection of the skin and subcutaneous tissue, unspecified: Secondary | ICD-10-CM

## 2022-03-13 DIAGNOSIS — R600 Localized edema: Secondary | ICD-10-CM | POA: Diagnosis present

## 2022-03-13 MED ORDER — AMOXICILLIN-POT CLAVULANATE 875-125 MG PO TABS
1.0000 | ORAL_TABLET | Freq: Once | ORAL | Status: AC
  Administered 2022-03-13: 1 via ORAL
  Filled 2022-03-13: qty 1

## 2022-03-13 MED ORDER — AMOXICILLIN-POT CLAVULANATE 875-125 MG PO TABS: 1.0000 | ORAL_TABLET | Freq: Two times a day (BID) | ORAL | 0 refills | Status: AC

## 2022-03-13 NOTE — Discharge Instructions (Addendum)
It is very important you schedule follow-up appoint with a dentist or an oral surgeon to have your teeth looked at.  You likely need to have some teeth removed from the back of your mouth, which can be a source of your infections.  Continue brushing your teeth at least twice a day, flossing, using mouthwash, keeping your mouth is clean and healthy as possible.  You will stop taking doxycycline at home.  Instead you will start taking Augmentin, which is a different antibiotic.  You can continue taking ibuprofen 800 mg every 8 hours as needed for the next several days, to help with pain and inflammation.  You can also take Tylenol for pain.  I expect that the pain, redness and swelling will gradually improve over the next few days.  If it is getting significantly worse, or you begin having fevers, please return to the emergency department.

## 2022-03-13 NOTE — ED Triage Notes (Signed)
Pt arrived POV, caox4, ambulatory. Pt c/o left sided facial swelling.  Pt reports hx of facial cellulitis. Pt reports he noticed increased redness and swelling to the L cheek. Pt denies pain at present. Pt states he has been taking doxycycline over this past week but has had no improvement.

## 2022-03-13 NOTE — ED Provider Notes (Signed)
[facility] EMERGENCY DEPT Provider Note   CSN: [id number] Arrival date & time: 03/13/22  1212     History  Chief Complaint  Patient presents with   Facial Swelling    Henry Hendrix is a 35 y.o. male presenting to ED with left-sided facial swelling.  He reports this is significantly worsened in the past 2 days.  He was hospitalized in July with a facial cellulitis and infection and found on CT imaging to have a 5 cm phlegmon in the left maxilla.  He was treated exclusively with antibiotics IV Unasyn and then Augmentin at the time of discharge and reports complete resolution of his infection.  He says he was unaware that this may potentially be related to his teeth, and has not been able to see a dentist.  HPI     Home Medications Prior to Admission medications   Medication Sig Start Date End Date Taking? Authorizing Provider  amoxicillin-clavulanate (AUGMENTIN) 875-125 MG tablet Take 1 tablet by mouth every 12 (twelve) hours for 12 days. 03/13/22 03/25/22 Yes Terald Sleeper, MD  amoxicillin-clavulanate (AUGMENTIN) 875-125 MG tablet Take 1 tablet by mouth every 12 (twelve) hours for 12 days. 03/13/22 03/25/22 Yes Kamare Caspers, Kermit Balo, MD  HYDROcodone-acetaminophen (NORCO) 7.5-325 MG tablet Take 1 tablet by mouth every 6 (six) hours as needed for moderate pain. Patient not taking: Reported on 10/15/2021 08/02/19   Eustace Moore, MD  ondansetron (ZOFRAN) 4 MG tablet Take 1-2 tablets (4-8 mg total) by mouth every 8 (eight) hours as needed for nausea or vomiting. Patient not taking: Reported on 10/15/2021 08/02/19   Eustace Moore, MD      Allergies    Patient has no known allergies.    Review of Systems   Review of Systems  Physical Exam Updated Vital Signs BP 131/77   Pulse 84   Temp 98.1 F (36.7 C) (Oral)   Resp 16   Ht 6' (1.829 m)   Wt 115.7 kg   SpO2 100%   BMI 34.58 kg/m  Physical Exam Constitutional:      General: He is not in acute  distress. HENT:     Head: Normocephalic and atraumatic.     Comments: Left-sided maxillary erythema and swelling, small region of induration over the left maxilla. Poor dentition with broken molars on the upper left side of the mouth, multiple caries Eyes:     Conjunctiva/sclera: Conjunctivae normal.     Pupils: Pupils are equal, round, and reactive to light.  Cardiovascular:     Rate and Rhythm: Normal rate and regular rhythm.  Pulmonary:     Effort: Pulmonary effort is normal. No respiratory distress.  Abdominal:     General: There is no distension.     Tenderness: There is no abdominal tenderness.  Skin:    General: Skin is warm and dry.  Neurological:     General: No focal deficit present.     Mental Status: He is alert. Mental status is at baseline.  Psychiatric:        Mood and Affect: Mood normal.        Behavior: Behavior normal.     ED Results / Procedures / Treatments   Labs (all labs ordered are listed, but only abnormal results are displayed) Labs Reviewed - No data to display  EKG None  Radiology No results found.  Procedures Procedures    Medications Ordered in ED Medications  amoxicillin-clavulanate (AUGMENTIN) 875-125 MG per tablet 1 tablet (1 tablet  Oral Given 03/13/22 1620)    ED Course/ Medical Decision Making/ A&P Clinical Course as of 03/13/22 1623  Sun Mar 13, 2022  1623 The paper prescription was also provided as they are not certain whether the pharmacies may be open at this hour, and with the holiday tomorrow.  They can use an alternative 24-hour pharmacy. [MT]    Clinical Course User Index [MT] Kenni Newton, Kermit Balo, MD                           Medical Decision Making Risk Prescription drug management.   Patient is here with suspected recurring facial infection, which I strongly suspect is secondary to a dental source.  We had this discussion at the bedside and will provide him with dental resources, I made clear that he needs to have  his teeth addressed, he will likely keep getting recurring infections.  He now verbalized understanding.  He is not showing signs or symptoms of sepsis.  No trismus.  Symptomatically he says that the swelling is nowhere near as bad as it was in July, and I do not think we need to repeat CT scan at this time.  He was managed excessively with antibiotics in July and will try again with a prolonged course of Augmentin today.  He is currently taking doxycycline but I suspect this may not have appropriate coverage for intraoral organisms, and I advise he stop taking the doxycycline and try Augmentin instead.  He verbalized understanding.  He can also continue NSAIDs at home.         Final Clinical Impression(s) / ED Diagnoses Final diagnoses:  Dental infection  Facial infection    Rx / DC Orders ED Discharge Orders          Ordered    amoxicillin-clavulanate (AUGMENTIN) 875-125 MG tablet  Every 12 hours        03/13/22 1616    amoxicillin-clavulanate (AUGMENTIN) 875-125 MG tablet  Every 12 hours        03/13/22 1623              Terald Sleeper, MD 03/13/22 1623
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
## 2022-04-01 ENCOUNTER — Telehealth: Payer: Commercial Managed Care - HMO | Admitting: Physician Assistant

## 2022-04-01 ENCOUNTER — Encounter: Payer: Self-pay | Admitting: Physician Assistant

## 2022-04-01 DIAGNOSIS — K0889 Other specified disorders of teeth and supporting structures: Secondary | ICD-10-CM | POA: Diagnosis not present

## 2022-04-01 MED ORDER — AMOXICILLIN-POT CLAVULANATE 875-125 MG PO TABS: 1.0000 | ORAL_TABLET | Freq: Two times a day (BID) | ORAL | 0 refills | Status: AC

## 2022-04-01 MED ORDER — PENICILLIN V POTASSIUM 500 MG PO TABS: 500.0000 mg | ORAL_TABLET | Freq: Three times a day (TID) | ORAL | 0 refills | Status: DC

## 2022-04-01 MED ORDER — AMOXICILLIN-POT CLAVULANATE 875-125 MG PO TABS: 1.0000 | ORAL_TABLET | Freq: Two times a day (BID) | ORAL | 0 refills | Status: DC

## 2022-04-01 NOTE — Progress Notes (Signed)

## 2022-04-01 NOTE — Progress Notes (Signed)
Virtual Visit Consent   Henry Hendrix, you are scheduled for a virtual visit with a [facility] provider today. Just as with appointments in the office, your consent must be obtained to participate. Your consent will be active for this visit and any virtual visit you may have with one of our providers in the next 365 days. If you have a MyChart account, a copy of this consent can be sent to you electronically.  As this is a virtual visit, video technology does not allow for your provider to perform a traditional examination. This may limit your provider's ability to fully assess your condition. If your provider identifies any concerns that need to be evaluated in person or the need to arrange testing (such as labs, EKG, etc.), we will make arrangements to do so. Although advances in technology are sophisticated, we cannot ensure that it will always work on either your end or our end. If the connection with a video visit is poor, the visit may have to be switched to a telephone visit. With either a video or telephone visit, we are not always able to ensure that we have a secure connection.  By engaging in this virtual visit, you consent to the provision of healthcare and authorize for your insurance to be billed (if applicable) for the services provided during this visit. Depending on your insurance coverage, you may receive a charge related to this service.  I need to obtain your verbal consent now. Are you willing to proceed with your visit today? Henry Hendrix has provided verbal consent on 04/01/2022 for a virtual visit (video or telephone). Henry Rad, PA-C  Date: 04/01/2022 8:05 AM  Virtual Visit via Video Note   I, Henry Hendrix, connected with  Henry Hendrix  ([id number], 18-Feb-1986) on 04/01/22 at  8:00 AM EST by a video-enabled telemedicine application and verified that I am speaking with the correct person using two identifiers.  Location: Patient: Virtual Visit Location Patient:  Home Provider: Virtual Visit Location Provider: Home Office   I discussed the limitations of evaluation and management by telemedicine and the availability of in person appointments. The patient expressed understanding and agreed to proceed.    History of Present Illness: Henry Hendrix is a 37 y.o. who identifies as a male who was assigned male at birth, and is being seen today for dental pain  and facial swelling.  States that he has a broken tooth on the left upper that is causing facial swelling.  States that he was seen in the emergency department on March 13, 2022 and was prescribed Augmentin with relief.  States that he is unsure if he has dental coverage, has not seen a dentist.  States that it is more uncomfortable than painful.  He is using ibuprofen for pain.   Problems:  Patient Active Problem List   Diagnosis Date Noted   Sepsis ([place]) 10/16/2021   Facial cellulitis 10/15/2021   Class 3 severe obesity due to excess calories without serious comorbidity in adult [facility]) 04/30/2018   History of acute pancreatitis 04/30/2018    Allergies: No Known Allergies Medications:  Current Outpatient Medications:    amoxicillin-clavulanate (AUGMENTIN) 875-125 MG tablet, Take 1 tablet by mouth 2 (two) times daily., Disp: 20 tablet, Rfl: 0   HYDROcodone-acetaminophen (NORCO) 7.5-325 MG tablet, Take 1 tablet by mouth every 6 (six) hours as needed for moderate pain. (Patient not taking: Reported on 10/15/2021), Disp: 15 tablet, Rfl: 0   ondansetron (ZOFRAN)  4 MG tablet, Take 1-2 tablets (4-8 mg total) by mouth every 8 (eight) hours as needed for nausea or vomiting. (Patient not taking: Reported on 10/15/2021), Disp: 20 tablet, Rfl: 0  Observations/Objective: Patient is well-developed, well-nourished in no acute distress.  Resting comfortably  at home.  Head is normocephalic, atraumatic.  No labored breathing.  Speech is clear and coherent with logical content.  Patient is alert and oriented  at baseline.    Assessment and Plan: 1. Pain, dental - amoxicillin-clavulanate (AUGMENTIN) 875-125 MG tablet; Take 1 tablet by mouth 2 (two) times daily.  Dispense: 20 tablet; Refill: 0  Patient had a visit earlier today, did want to change medication from penicillin to Augmentin, stated that when he was seen in the emergency department and was prescribed Augmentin it did offer relief.  Patient education given on supportive care, red flags given for prompt reevaluation.  Patient strongly encouraged to make appointment with dentistry as soon as possible.  Patient understands and agrees  Follow Up Instructions: I discussed the assessment and treatment plan with the patient. The patient was provided an opportunity to ask questions and all were answered. The patient agreed with the plan and demonstrated an understanding of the instructions.  A copy of instructions were sent to the patient via MyChart unless otherwise noted below.     The patient was advised to call back or seek an in-person evaluation if the symptoms worsen or if the condition fails to improve as anticipated.  Time:  I spent 9 minutes with the patient via telehealth technology discussing the above problems/concerns.    Henry Grip Mayers, PA-C

## 2022-04-01 NOTE — Patient Instructions (Signed)
Henry Hendrix, thank you for joining Kennieth Rad, PA-C for today's virtual visit.  While this provider is not your primary care provider (PCP), if your PCP is located in our provider database this encounter information will be shared with them immediately following your visit.   [facility] account gives you access to today's visit and all your visits, tests, and labs performed at [facility] " click here if you don't have a [facility] account or go to [email or web address]  Consent: (Patient) Henry Hendrix provided verbal consent for this virtual visit at the beginning of the encounter.  Current Medications:  Current Outpatient Medications:    amoxicillin-clavulanate (AUGMENTIN) 875-125 MG tablet, Take 1 tablet by mouth 2 (two) times daily., Disp: 20 tablet, Rfl: 0   HYDROcodone-acetaminophen (NORCO) 7.5-325 MG tablet, Take 1 tablet by mouth every 6 (six) hours as needed for moderate pain. (Patient not taking: Reported on 10/15/2021), Disp: 15 tablet, Rfl: 0   ondansetron (ZOFRAN) 4 MG tablet, Take 1-2 tablets (4-8 mg total) by mouth every 8 (eight) hours as needed for nausea or vomiting. (Patient not taking: Reported on 10/15/2021), Disp: 20 tablet, Rfl: 0   Medications ordered in this encounter:  Meds ordered this encounter  Medications   DISCONTD: amoxicillin-clavulanate (AUGMENTIN) 875-125 MG tablet    Sig: Take 1 tablet by mouth 2 (two) times daily.    Dispense:  20 tablet    Refill:  0    Order Specific Question:   Supervising Provider    Answer:   Chase Picket [id number]   amoxicillin-clavulanate (AUGMENTIN) 875-125 MG tablet    Sig: Take 1 tablet by mouth 2 (two) times daily.    Dispense:  20 tablet    Refill:  0    Instead of pen VK    Order Specific Question:   Supervising Provider    Answer:   Chase Picket [[id number]]     *If you need refills on other medications prior to your next appointment, please contact your  pharmacy*  Follow-Up: Call back or seek an in-person evaluation if the symptoms worsen or if the condition fails to improve as anticipated.  [place] [phone]  Other Instructions Dental Pain Dental pain is often a sign that something is wrong with your teeth or gums. It is also something that can occur following dental treatment. If you have dental pain, it is important to contact your dental care provider, especially if the cause of the pain has not been determined. Dental pain may be of varying intensity and can be caused by many things, including: Tooth decay (cavities or caries). Cavities are caused by bacteria that produce acids that irritate the nerve of your tooth, making it sensitive to air and hot or cold temperatures. This eventually causes discomfort or pain. Abscess or infection. Once the bacteria reach the inner part of the tooth (pulp), a bacterial infection (dental abscess) can occur. Pus typically collects at the end of the root of a tooth. Injury. A crack in the tooth. Gum recession exposing the root, and possibly the nerves, of a tooth. Gum (periodontal)disease. Abnormal grinding or clenching. Poor or improper home care. An unknown reason (idiopathic). Your pain may be mild or severe. It may occur when you are: Chewing. Exposed to hot or cold temperatures. Eating or drinking sugary foods or beverages, such as soda or candy. Your pain may be constant, or it may come and go without cause. Follow these  instructions at home: The following actions may help to lessen any discomfort that you are feeling before or after getting dental care. Medicines Take over-the-counter and prescription medicines only as told by your dental care provider. If you were prescribed an antibiotic medicine, take it as told by your dental care provider. Do not stop taking the antibiotic even if you start to feel better. Eating and drinking Avoid foods or drinks that cause you  pain, such as: Very hot or very cold foods or drinks. Sweet or sugary foods or drinks. Managing pain and swelling  Ice can sometimes be used to reduce pain and swelling, especially if the pain is following dental treatment. If directed, put ice on the painful area of your face. To do this: Put ice in a plastic bag. Place a towel between your skin and the bag. Leave the ice on for 20 minutes, 2-3 times a day. Remove the ice if your skin turns bright red. This is very important. If you cannot feel pain, heat, or cold, you have a greater risk of damage to the area. Brushing your teeth To keep your mouth and gums healthy, brush your teeth twice a day using a fluoride toothpaste. Use a toothpaste made for sensitive teeth as directed by your dental care provider, especially if the root is exposed. Always brush your teeth with a soft-bristled toothbrush. This will help prevent irritation to your gums. General instructions Floss at least once a day. Do not apply heat to the outside of the face. Gargle with a mixture of salt and water 3-4 times a day or as needed. To make salt water, completely dissolve -1 tsp (3-6 g) of salt in 1 cup (237 mL) of warm water. Keep all follow-up visits. This is important. Contact a dental care provider if: You have any unexplained dental pain. Your pain is not controlled with medicines. Your symptoms get worse. You have new symptoms. Get help right away if: You are unable to open your mouth. You are having trouble breathing or swallowing. You have a fever. You notice that your face, neck, or jaw is swollen. These symptoms may represent a serious problem that is an emergency. Do not wait to see if the symptoms will go away. Get medical help right away. Call your local emergency services (911 in the [place] Do not drive yourself to the hospital. Summary Dental pain may be caused by many things, including tooth decay and infection. Your pain may be mild or  severe. Take over-the-counter and prescription medicines only as told by your dental care provider. Watch your dental pain for any changes. Let your dental care provider know if your symptoms get worse. This information is not intended to replace advice given to you by your health care provider. Make sure you discuss any questions you have with your health care provider. Document Revised: 12/11/2019 Document Reviewed: 12/11/2019 Elsevier Patient Education  [place].    If you have been instructed to have an in-person evaluation today at a local Urgent Care facility, please use the link below. It will take you to a list of all of our available [facility], including address, phone number and hours of operation. Please do not delay care.   [facility]  If you or a family member do not have a primary care provider, use the link below to schedule a visit and establish care. When you choose a [facility] primary care physician or advanced practice provider, you gain  a long-term partner in health. Find a Primary Care Provider  Learn more about [facility] in-office and virtual care options: [facility]

## 2022-04-02 ENCOUNTER — Telehealth: Payer: Commercial Managed Care - HMO | Admitting: Nurse Practitioner

## 2022-04-02 DIAGNOSIS — K047 Periapical abscess without sinus: Secondary | ICD-10-CM | POA: Diagnosis not present

## 2022-04-02 NOTE — Progress Notes (Signed)
Virtual Visit Consent   Henry Hendrix, you are scheduled for a virtual visit with Henry Hendrix, [place], a [facility] provider, today.     Just as with appointments in the office, your consent must be obtained to participate.  Your consent will be active for this visit and any virtual visit you may have with one of our providers in the next 365 days.     If you have a MyChart account, a copy of this consent can be sent to you electronically.  All virtual visits are billed to your insurance company just like a traditional visit in the office.    As this is a virtual visit, video technology does not allow for your provider to perform a traditional examination.  This may limit your provider's ability to fully assess your condition.  If your provider identifies any concerns that need to be evaluated in person or the need to arrange testing (such as labs, EKG, etc.), we will make arrangements to do so.     Although advances in technology are sophisticated, we cannot ensure that it will always work on either your end or our end.  If the connection with a video visit is poor, the visit may have to be switched to a telephone visit.  With either a video or telephone visit, we are not always able to ensure that we have a secure connection.     I need to obtain your verbal consent now.   Are you willing to proceed with your visit today? YES   [name] has provided verbal consent on 04/02/2022 for a virtual visit (video or telephone).   Henry Hassell Done, FNP   Date: 04/02/2022 10:43 AM   Virtual Visit via Video Note   I, Henry Hendrix, connected with Henry Hendrix ([id number], November 17, 1985) on 04/02/22 at 11:45 AM EST by a video-enabled telemedicine application and verified that I am speaking with the correct person using two identifiers.  Location: Patient: Virtual Visit Location Patient: Home Provider: Virtual Visit Location Provider: Mobile   I discussed the limitations  of evaluation and management by telemedicine and the availability of in person appointments. The patient expressed understanding and agreed to proceed.    History of Present Illness: Henry Hendrix is a 37 y.o. who identifies as a male who was assigned male at birth, and is being seen today for face swelling .  HPI: Patient had dental abscess before christmas. Started back yesterday. Did evisit  yesterday and was given augmentin- the family did not want that so was changed to penicillin. He is not sure which to take. They tried amoxicillin yesterday and woke u this morning with jaw still hurting and swollen. He then started Pencillin Vk this morning and that seems  to have helped some.    ROS  Problems:  Patient Active Problem List   Diagnosis Date Noted   Sepsis ([place]) 10/16/2021   Facial cellulitis 10/15/2021   Class 3 severe obesity due to excess calories without serious comorbidity in adult [facility]) 04/30/2018   History of acute pancreatitis 04/30/2018    Allergies: No Known Allergies Medications:  Current Outpatient Medications:    amoxicillin-clavulanate (AUGMENTIN) 875-125 MG tablet, Take 1 tablet by mouth 2 (two) times daily., Disp: 20 tablet, Rfl: 0   HYDROcodone-acetaminophen (NORCO) 7.5-325 MG tablet, Take 1 tablet by mouth every 6 (six) hours as needed for moderate pain. (Patient not taking: Reported on 10/15/2021), Disp: 15 tablet, Rfl: 0   ondansetron (ZOFRAN)  4 MG tablet, Take 1-2 tablets (4-8 mg total) by mouth every 8 (eight) hours as needed for nausea or vomiting. (Patient not taking: Reported on 10/15/2021), Disp: 20 tablet, Rfl: 0  Observations/Objective: Patient is well-developed, well-nourished in no acute distress.  Resting comfortably  at home.  Head is normocephalic, atraumatic.  No labored breathing.  Speech is clear and coherent with logical content.  Patient is alert and oriented at baseline.  Left cheek swollen  Assessment and Plan:  Henry Hendrix in  today with chief complaint of Facial Swelling   1. Dental abscess Heating pad  or ice, which ever works Need to see dentist ASAP Take pencillin VK as prescribed    Follow Up Instructions: I discussed the assessment and treatment plan with the patient. The patient was provided an opportunity to ask questions and all were answered. The patient agreed with the plan and demonstrated an understanding of the instructions.  A copy of instructions were sent to the patient via MyChart.  The patient was advised to call back or seek an in-person evaluation if the symptoms worsen or if the condition fails to improve as anticipated.  Time:  I spent 12 minutes with the patient via telehealth technology discussing the above problems/concerns.    Henry Hassell Done, FNP
